# Patient Record
Sex: Female | Born: 1950 | Race: White | Hispanic: No | Marital: Married
Health system: Southern US, Community
[De-identification: ages and names within clinical notes are randomized; demographics above are authoritative.]

---

## 1997-10-22 ENCOUNTER — Other Ambulatory Visit: Admission: RE | Admit: 1997-10-22 | Discharge: 1997-10-22 | Payer: Self-pay | Admitting: Obstetrics & Gynecology

## 1998-11-03 ENCOUNTER — Other Ambulatory Visit: Admission: RE | Admit: 1998-11-03 | Discharge: 1998-11-03 | Payer: Self-pay | Admitting: Obstetrics and Gynecology

## 2001-03-14 ENCOUNTER — Other Ambulatory Visit: Admission: RE | Admit: 2001-03-14 | Discharge: 2001-03-14 | Payer: Self-pay | Admitting: Obstetrics & Gynecology

## 2002-04-10 ENCOUNTER — Other Ambulatory Visit: Admission: RE | Admit: 2002-04-10 | Discharge: 2002-04-10 | Payer: Self-pay | Admitting: Obstetrics & Gynecology

## 2003-03-26 ENCOUNTER — Encounter: Payer: Self-pay | Admitting: Emergency Medicine

## 2003-03-26 ENCOUNTER — Emergency Department (HOSPITAL_COMMUNITY): Admission: AD | Admit: 2003-03-26 | Discharge: 2003-03-27 | Payer: Self-pay | Admitting: Emergency Medicine

## 2003-04-03 ENCOUNTER — Ambulatory Visit (HOSPITAL_COMMUNITY): Admission: RE | Admit: 2003-04-03 | Discharge: 2003-04-03 | Payer: Self-pay | Admitting: Emergency Medicine

## 2003-04-03 ENCOUNTER — Encounter: Payer: Self-pay | Admitting: Emergency Medicine

## 2003-04-08 ENCOUNTER — Ambulatory Visit (HOSPITAL_COMMUNITY): Admission: RE | Admit: 2003-04-08 | Discharge: 2003-04-08 | Payer: Self-pay | Admitting: Emergency Medicine

## 2004-04-07 ENCOUNTER — Other Ambulatory Visit: Admission: RE | Admit: 2004-04-07 | Discharge: 2004-04-07 | Payer: Self-pay | Admitting: Obstetrics & Gynecology

## 2004-06-03 ENCOUNTER — Emergency Department (HOSPITAL_COMMUNITY): Admission: EM | Admit: 2004-06-03 | Discharge: 2004-06-03 | Payer: Self-pay | Admitting: Emergency Medicine

## 2005-07-14 ENCOUNTER — Other Ambulatory Visit: Admission: RE | Admit: 2005-07-14 | Discharge: 2005-07-14 | Payer: Self-pay | Admitting: Obstetrics & Gynecology

## 2009-09-17 ENCOUNTER — Encounter: Admission: RE | Admit: 2009-09-17 | Discharge: 2009-09-17 | Payer: Self-pay | Admitting: Obstetrics & Gynecology

## 2013-01-25 ENCOUNTER — Other Ambulatory Visit: Payer: Self-pay | Admitting: Obstetrics & Gynecology

## 2013-02-09 ENCOUNTER — Other Ambulatory Visit: Payer: Self-pay

## 2013-02-13 ENCOUNTER — Ambulatory Visit
Admission: RE | Admit: 2013-02-13 | Discharge: 2013-02-13 | Disposition: A | Discharge status: Home / Self Care | Payer: BC Managed Care – PPO | Source: Ambulatory Visit | Attending: Obstetrics & Gynecology | Admitting: Obstetrics & Gynecology

## 2013-12-17 ENCOUNTER — Other Ambulatory Visit: Payer: Self-pay

## 2013-12-17 DIAGNOSIS — Z1231 Encounter for screening mammogram for malignant neoplasm of breast: Secondary | ICD-10-CM

## 2014-01-31 ENCOUNTER — Other Ambulatory Visit: Payer: Self-pay | Admitting: Obstetrics & Gynecology

## 2014-01-31 DIAGNOSIS — Z780 Asymptomatic menopausal state: Secondary | ICD-10-CM

## 2014-02-14 ENCOUNTER — Ambulatory Visit
Admission: RE | Admit: 2014-02-14 | Discharge: 2014-02-14 | Disposition: A | Discharge status: Home / Self Care | Payer: BC Managed Care – PPO | Source: Ambulatory Visit | Attending: Obstetrics & Gynecology | Admitting: Obstetrics & Gynecology

## 2014-02-14 ENCOUNTER — Ambulatory Visit
Admission: RE | Admit: 2014-02-14 | Discharge: 2014-02-14 | Disposition: A | Discharge status: Home / Self Care | Payer: BC Managed Care – PPO | Source: Ambulatory Visit

## 2014-02-14 ENCOUNTER — Ambulatory Visit: Payer: BC Managed Care – PPO

## 2014-02-14 DIAGNOSIS — Z780 Asymptomatic menopausal state: Secondary | ICD-10-CM

## 2014-02-14 DIAGNOSIS — Z1231 Encounter for screening mammogram for malignant neoplasm of breast: Secondary | ICD-10-CM

## 2015-12-22 ENCOUNTER — Other Ambulatory Visit: Payer: Self-pay | Admitting: Obstetrics & Gynecology

## 2015-12-22 DIAGNOSIS — Z1231 Encounter for screening mammogram for malignant neoplasm of breast: Secondary | ICD-10-CM

## 2016-01-27 ENCOUNTER — Ambulatory Visit
Admission: RE | Admit: 2016-01-27 | Discharge: 2016-01-27 | Disposition: A | Discharge status: Home / Self Care | Payer: Medicare Other | Source: Ambulatory Visit | Attending: Obstetrics & Gynecology | Admitting: Obstetrics & Gynecology

## 2016-01-27 DIAGNOSIS — Z1231 Encounter for screening mammogram for malignant neoplasm of breast: Secondary | ICD-10-CM

## 2018-04-13 ENCOUNTER — Other Ambulatory Visit: Payer: Self-pay | Admitting: Family Medicine

## 2018-04-13 DIAGNOSIS — Z1231 Encounter for screening mammogram for malignant neoplasm of breast: Secondary | ICD-10-CM

## 2018-04-13 DIAGNOSIS — M8580 Other specified disorders of bone density and structure, unspecified site: Secondary | ICD-10-CM

## 2018-06-16 ENCOUNTER — Other Ambulatory Visit: Payer: Self-pay | Admitting: Family Medicine

## 2018-06-16 DIAGNOSIS — M810 Age-related osteoporosis without current pathological fracture: Secondary | ICD-10-CM

## 2018-06-20 ENCOUNTER — Ambulatory Visit: Payer: Medicare Other

## 2018-06-20 ENCOUNTER — Other Ambulatory Visit: Payer: Medicare Other

## 2018-08-10 ENCOUNTER — Ambulatory Visit
Admission: RE | Admit: 2018-08-10 | Discharge: 2018-08-10 | Disposition: A | Discharge status: Home / Self Care | Payer: Medicare Other | Source: Ambulatory Visit | Attending: Family Medicine | Admitting: Family Medicine

## 2018-08-10 DIAGNOSIS — M810 Age-related osteoporosis without current pathological fracture: Secondary | ICD-10-CM

## 2018-08-10 DIAGNOSIS — Z1231 Encounter for screening mammogram for malignant neoplasm of breast: Secondary | ICD-10-CM

## 2018-08-14 ENCOUNTER — Other Ambulatory Visit: Payer: Self-pay | Admitting: Family Medicine

## 2018-08-14 DIAGNOSIS — R928 Other abnormal and inconclusive findings on diagnostic imaging of breast: Secondary | ICD-10-CM

## 2018-08-16 ENCOUNTER — Ambulatory Visit
Admission: RE | Admit: 2018-08-16 | Discharge: 2018-08-16 | Disposition: A | Discharge status: Home / Self Care | Payer: Medicare Other | Source: Ambulatory Visit | Attending: Family Medicine | Admitting: Family Medicine

## 2018-08-16 DIAGNOSIS — R928 Other abnormal and inconclusive findings on diagnostic imaging of breast: Secondary | ICD-10-CM

## 2019-06-14 IMAGING — MG DIGITAL SCREENING BILATERAL MAMMOGRAM WITH TOMO AND CAD
6 of 12 series · 6 of 36 positions shown · non-contrast
Comparison: Previous exam(s).

CLINICAL DATA: Screening.

EXAM:
DIGITAL SCREENING BILATERAL MAMMOGRAM WITH TOMO AND CAD

[R CC synth-2D (1 of 2)]
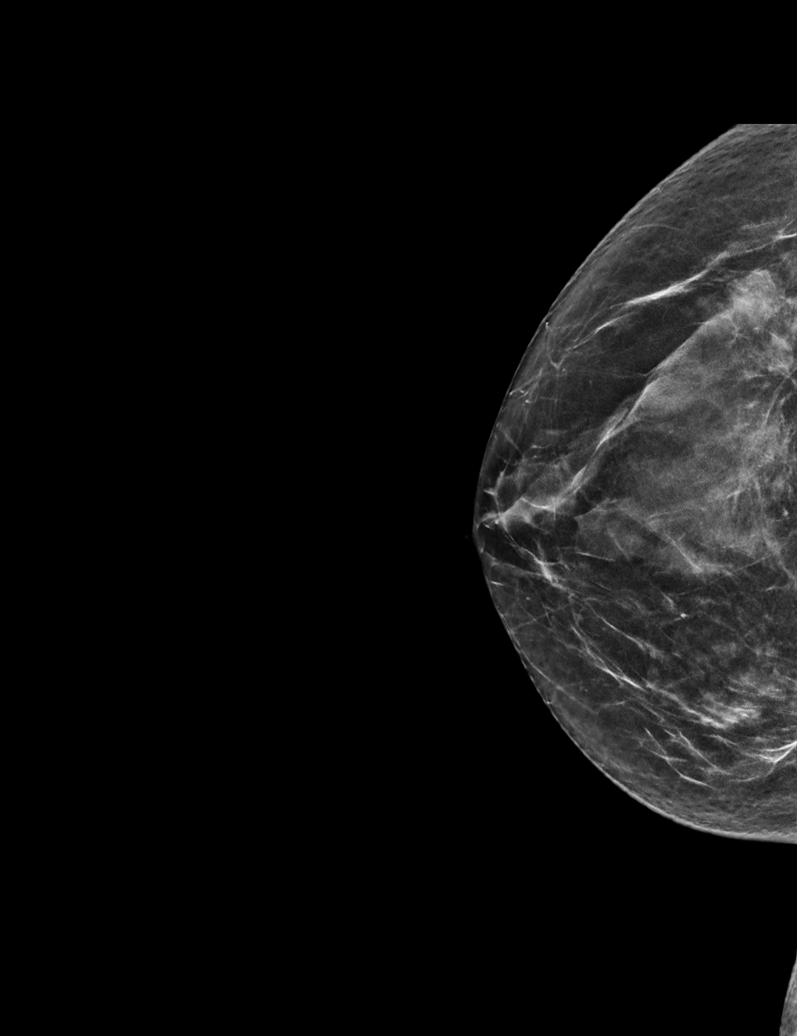

[R CC synth-2D (2 of 2)]
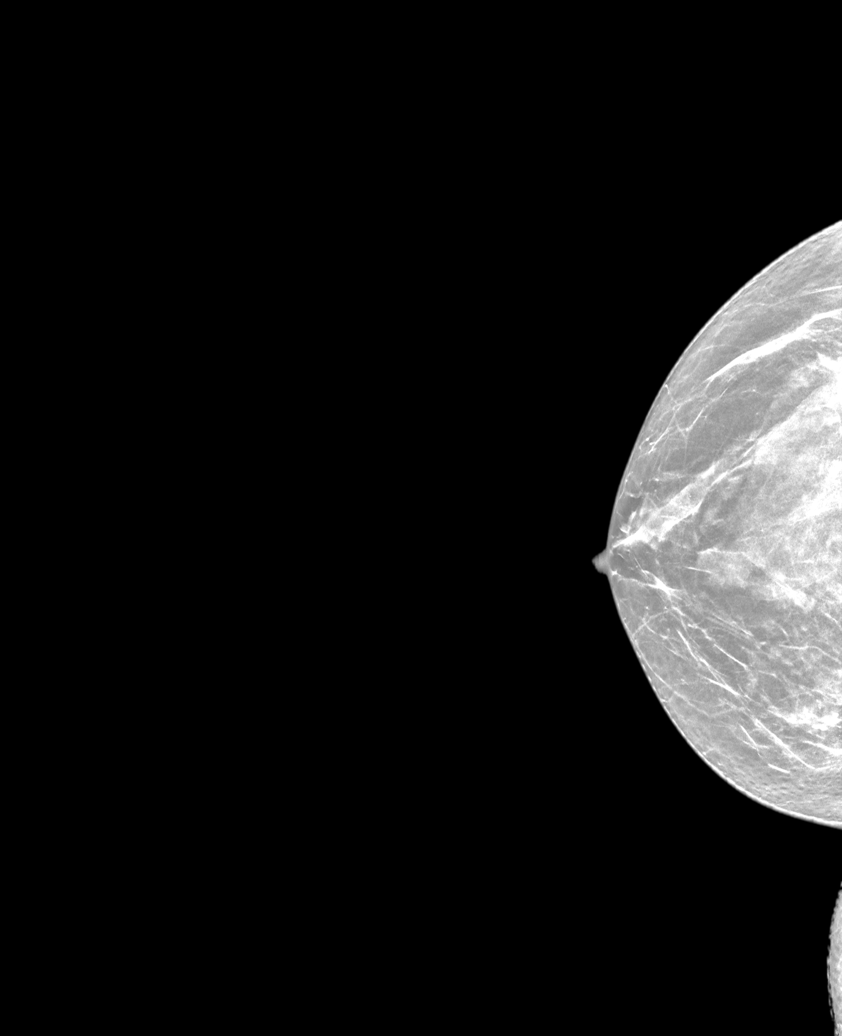

[R MLO synth-2D]
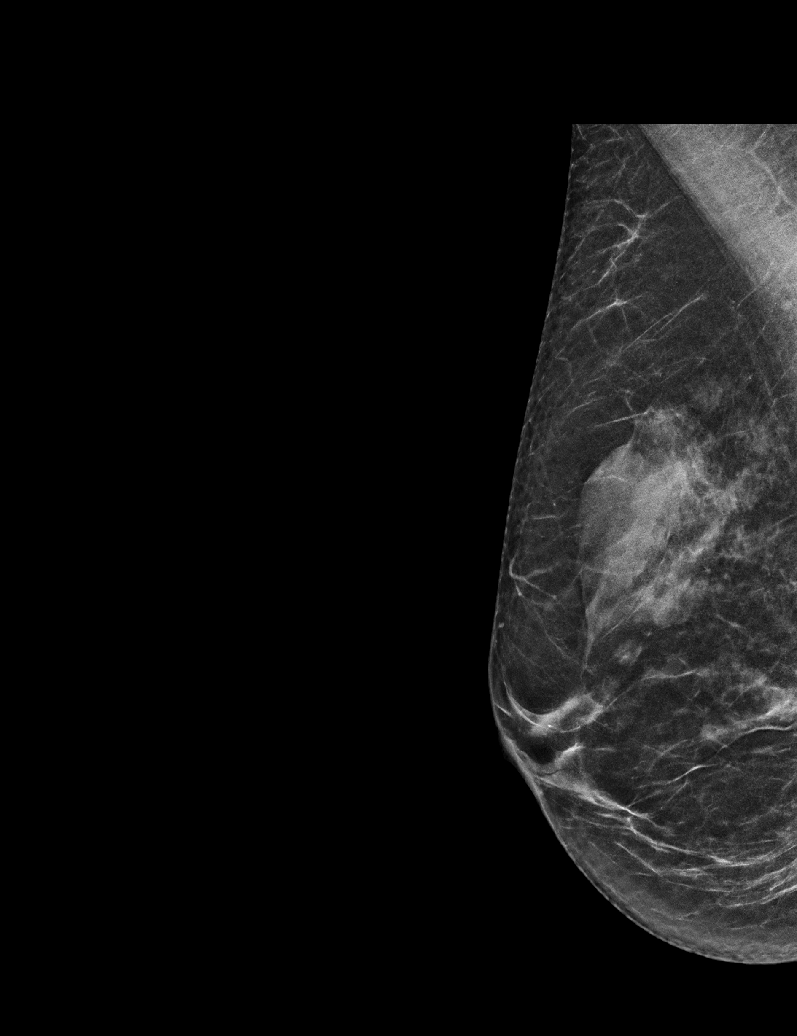

[L CC synth-2D (1 of 2)]
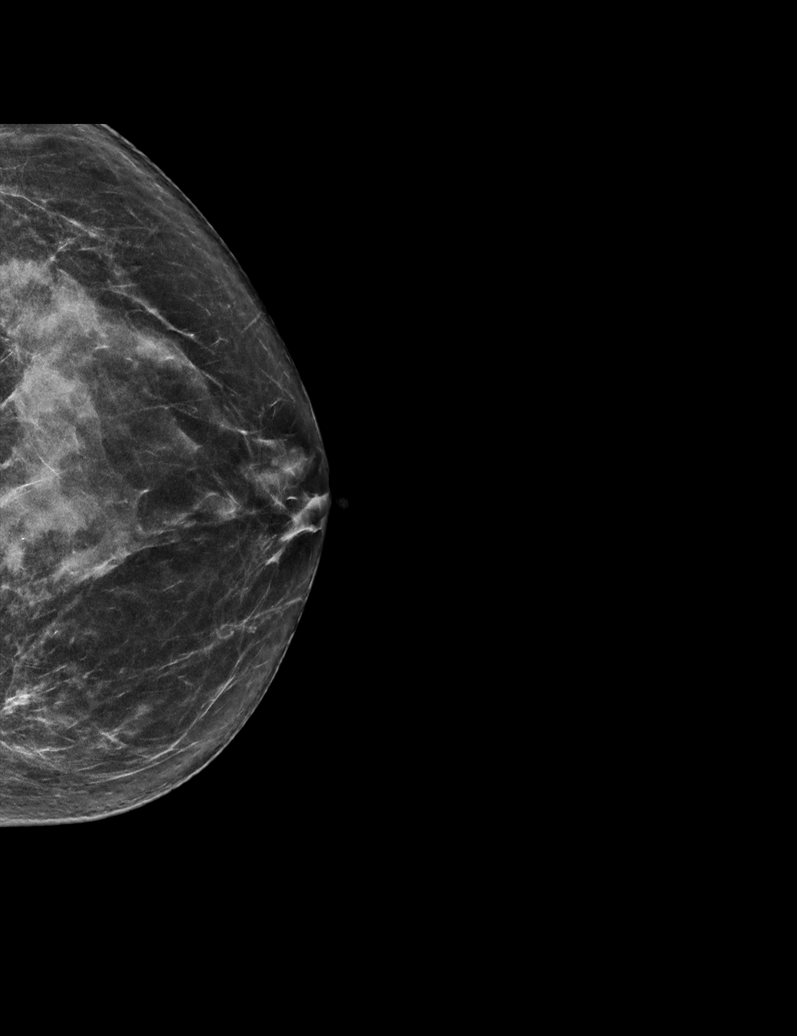

[L MLO synth-2D]
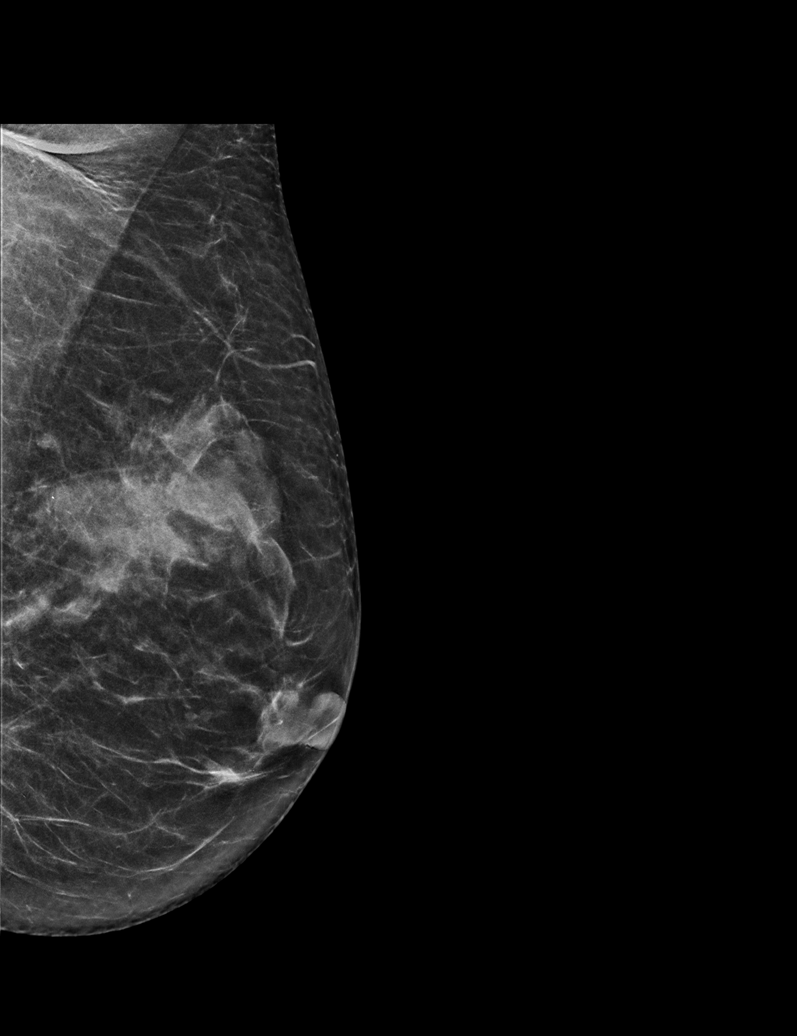

[L CC synth-2D (2 of 2)]
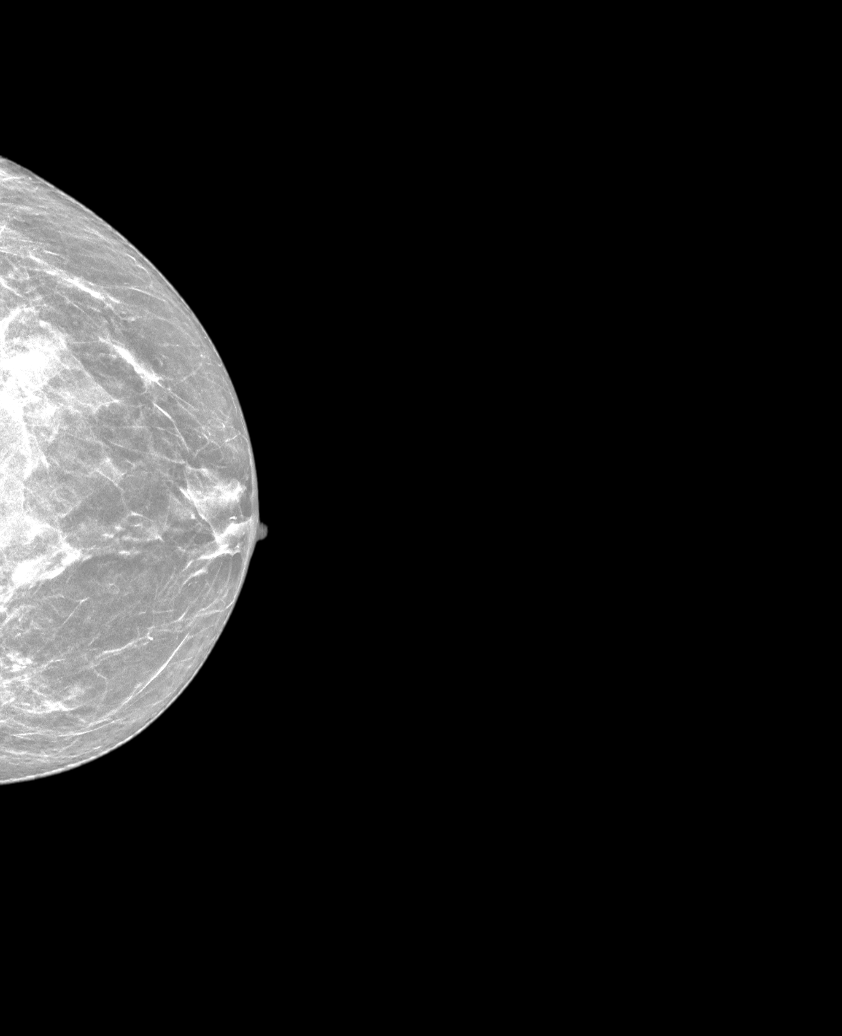

[6 of 36 positions shown; findings below may reference images not displayed]

ACR Breast Density Category c: The breast tissue is heterogeneously
dense, which may obscure small masses.
FINDINGS: In the left breast, a possible asymmetry warrants further
evaluation. In the right breast, no findings suspicious for
malignancy. Images were processed with CAD.
IMPRESSION: Further evaluation is suggested for possible asymmetry in the left
breast.

RECOMMENDATION:
Diagnostic mammogram and possibly ultrasound of the left breast.
(Code:[id number]-881)

The patient will be contacted regarding the findings, and additional
imaging will be scheduled.

BI-RADS CATEGORY  0: Incomplete. Need additional imaging evaluation
and/or prior mammograms for comparison.

## 2019-06-20 IMAGING — US ULTRASOUND LEFT BREAST LIMITED
1 series · 6 of 6 positions shown · non-contrast
Comparison: Previous exam(s).

CLINICAL DATA: Screening recall for a possible left breast mass.

EXAM:
DIGITAL DIAGNOSTIC UNILATERAL LEFT MAMMOGRAM WITH CAD AND TOMO
LEFT BREAST ULTRASOUND

[Series 1: ultrasound left breast limited · 0.05mm/px · 6 of 6 slices shown]
[im 1/6]
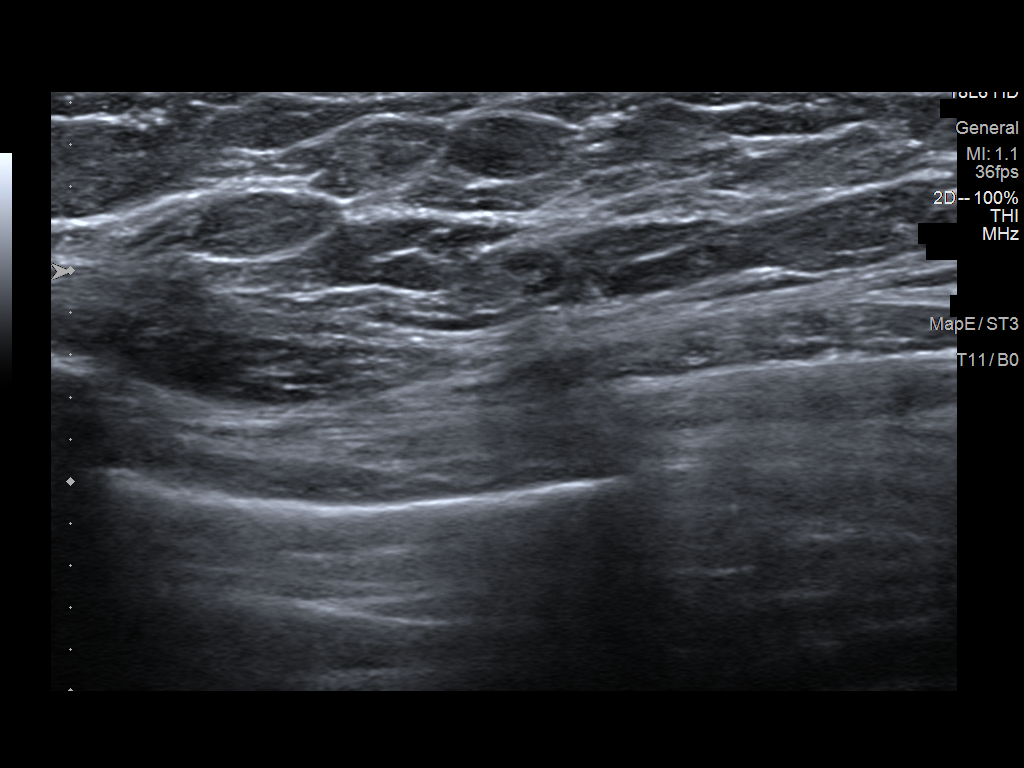
[im 2/6]
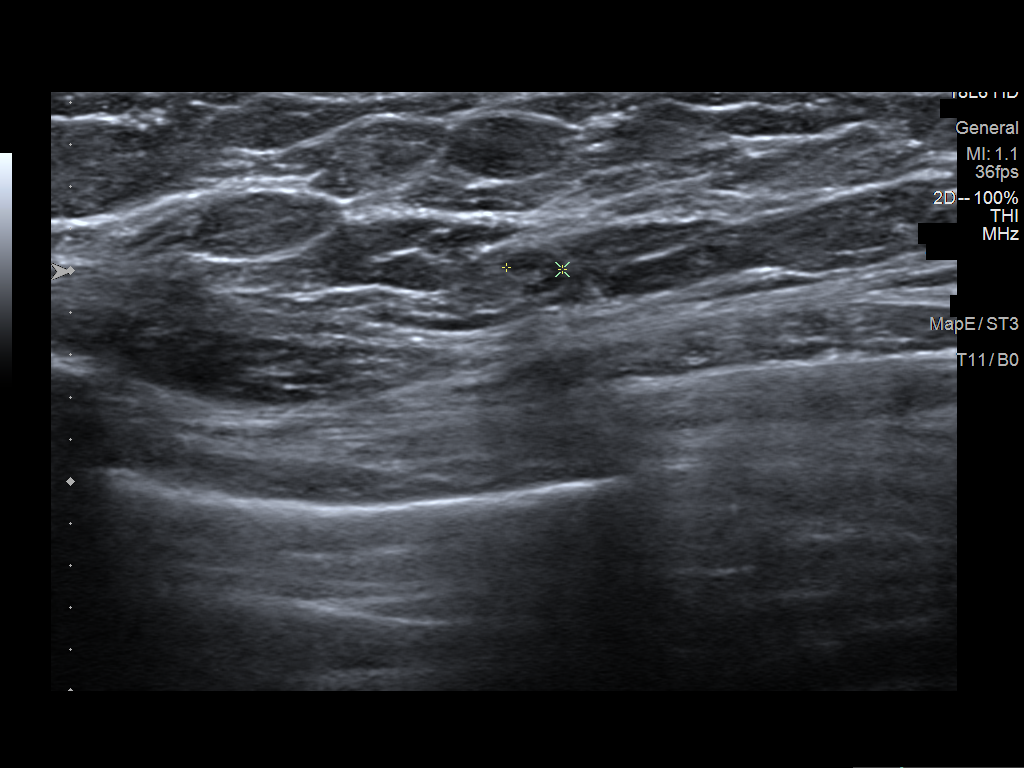
[im 3/6]
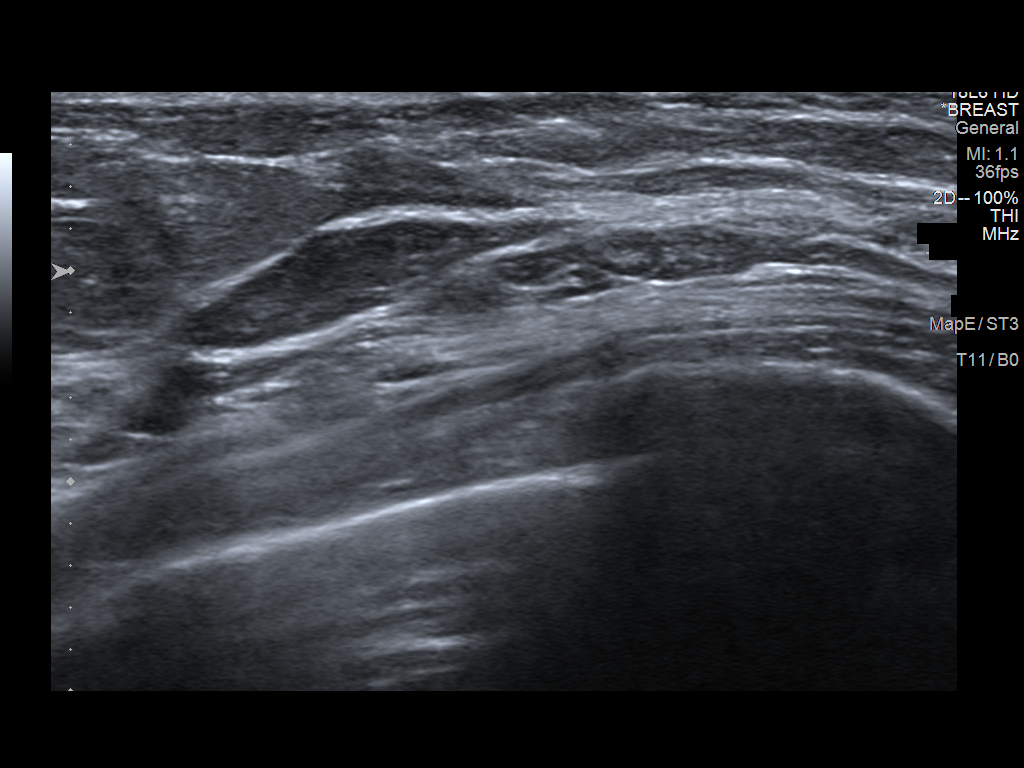
[im 4/6]
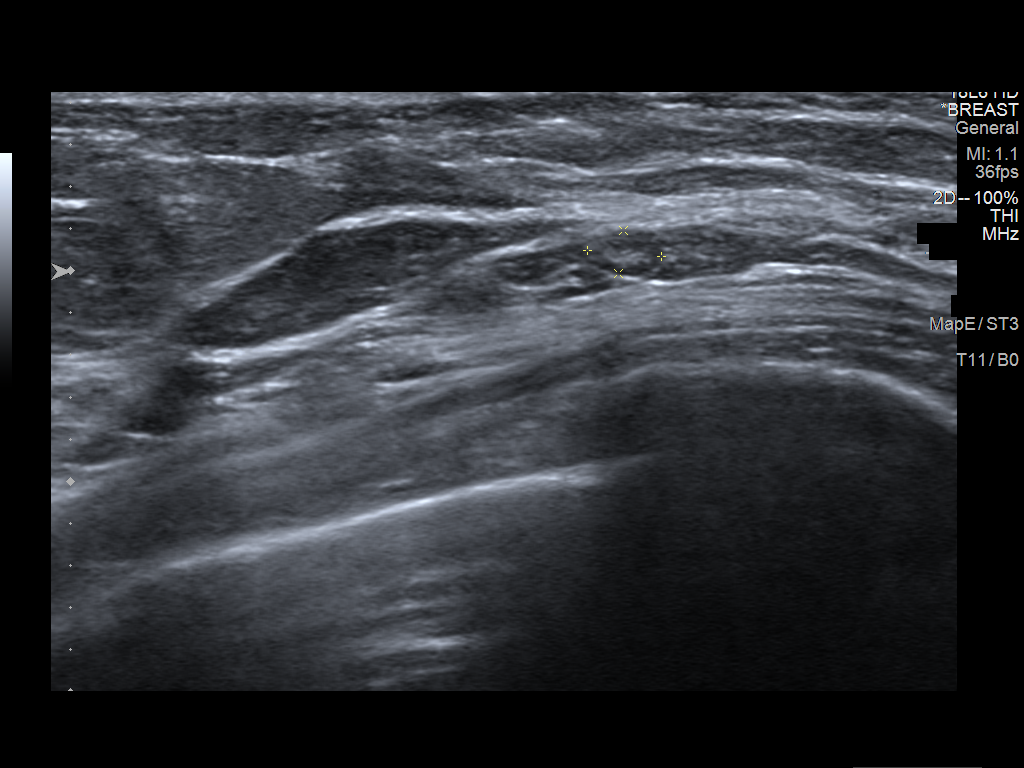
[im 5/6]
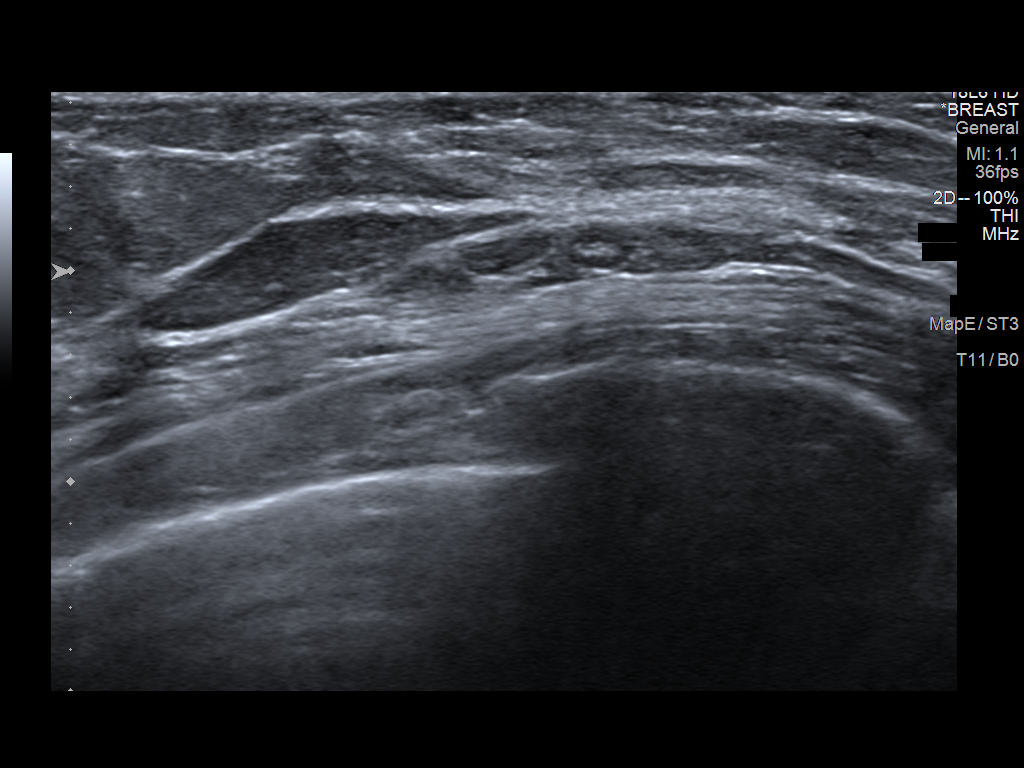
[im 6/6]
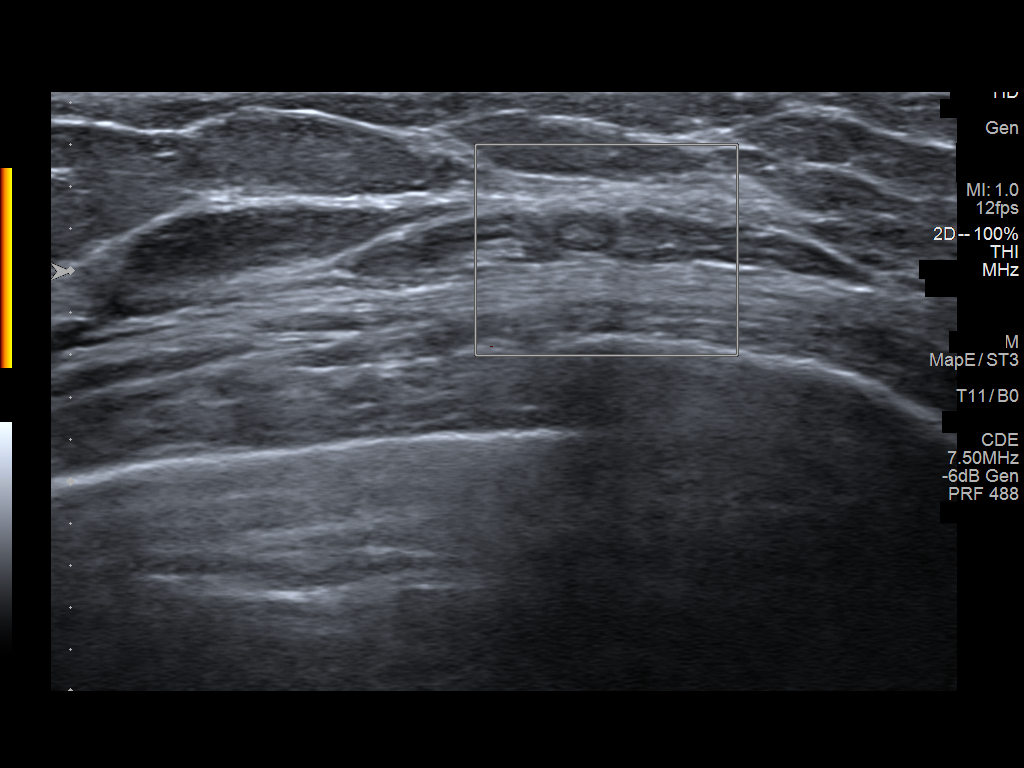

[6 of 6 positions shown; findings below may reference images not displayed]

ACR Breast Density Category c: The breast tissue is heterogeneously
dense, which may obscure small masses.
FINDINGS: In the upper-outer quadrant of the left breast, far posterior depth,
there is a round circumscribed mass measuring 3 mm. There is a
possible small central lucency suggesting this is a lymph node.

Mammographic images were processed with CAD.

Ultrasound of the left breast at 3 o'clock, 7 cm from the nipple
demonstrates a 3-4 mm red form lymph node with a fatty hilar notch.
IMPRESSION: The mass identified on the screening mammogram in the left breast
corresponds with a benign lymph node.

RECOMMENDATION:
Screening mammogram in one year.(Code:[id number])

I have discussed the findings and recommendations with the patient.
Results were also provided in writing at the conclusion of the
visit. If applicable, a reminder letter will be sent to the patient
regarding the next appointment.

BI-RADS CATEGORY  1: Negative.

## 2019-07-13 ENCOUNTER — Ambulatory Visit: Payer: Medicare Other

## 2019-07-20 ENCOUNTER — Ambulatory Visit: Payer: Medicare PPO | Attending: Internal Medicine

## 2019-07-20 DIAGNOSIS — Z23 Encounter for immunization: Secondary | ICD-10-CM | POA: Insufficient documentation

## 2019-07-20 NOTE — Progress Notes (Signed)
   Covid-19 Vaccination Clinic  Name:  Ethel Meisenheimer    MRN: [id number] DOB: March 15, 1951  07/20/2019  Ms. Truett was observed post Covid-19 immunization for 15 minutes without incidence. She was provided with Vaccine Information Sheet and instruction to access the V-Safe system.   Ms. Fehrenbach was instructed to call 911 with any severe reactions post vaccine: Marland Kitchen Difficulty breathing  . Swelling of your face and throat  . A fast heartbeat  . A bad rash all over your body  . Dizziness and weakness    Immunizations Administered    Name Date Dose VIS Date Route   Pfizer COVID-19 Vaccine 07/20/2019  8:57 AM 0.3 mL 05/25/2019 Intramuscular   Manufacturer: [employer], [employer]   Lot: [id number]   NDC: [address]-0

## 2019-07-24 ENCOUNTER — Ambulatory Visit: Payer: Medicare Other

## 2019-08-14 ENCOUNTER — Ambulatory Visit: Payer: Medicare PPO | Attending: Internal Medicine

## 2019-08-14 DIAGNOSIS — Z23 Encounter for immunization: Secondary | ICD-10-CM | POA: Insufficient documentation

## 2019-08-14 NOTE — Progress Notes (Signed)
   Covid-19 Vaccination Clinic  Name:  Savannah Benson    MRN: [id number] DOB: [birth year]  08/14/2019  [name] was observed post Covid-19 immunization for 15 minutes without incident. She was provided with Vaccine Information Sheet and instruction to access the V-Safe system.   [name] was instructed to call 911 with any severe reactions post vaccine: Marland Kitchen Difficulty breathing  . Swelling of face and throat  . A fast heartbeat  . A bad rash all over body  . Dizziness and weakness   Immunizations Administered    Name Date Dose VIS Date Route   Pfizer COVID-19 Vaccine 08/14/2019  8:47 AM 0.3 mL 05/25/2019 Intramuscular   Manufacturer: [employer], [employer]   Lot: [id number]   NDC: [address]-2

## 2020-04-29 ENCOUNTER — Other Ambulatory Visit: Payer: Self-pay | Admitting: Family Medicine

## 2020-04-29 ENCOUNTER — Ambulatory Visit
Admission: RE | Admit: 2020-04-29 | Discharge: 2020-04-29 | Disposition: A | Discharge status: Home / Self Care | Payer: Medicare PPO | Source: Ambulatory Visit | Attending: Family Medicine | Admitting: Family Medicine

## 2020-04-29 DIAGNOSIS — M546 Pain in thoracic spine: Secondary | ICD-10-CM
